# Patient Record
Sex: Female | Born: 1995 | Race: White | Hispanic: No | Marital: Single | Smoking: Never smoker
Health system: Southern US, Community
[De-identification: ages and names within clinical notes are randomized; demographics above are authoritative.]

---
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
## 2019-03-13 ENCOUNTER — Ambulatory Visit (LOCAL_COMMUNITY_HEALTH_CENTER): Payer: Self-pay

## 2019-03-13 ENCOUNTER — Other Ambulatory Visit: Payer: Self-pay

## 2019-03-13 DIAGNOSIS — Z23 Encounter for immunization: Secondary | ICD-10-CM

## 2019-03-29 ENCOUNTER — Other Ambulatory Visit: Payer: Self-pay

## 2019-03-29 ENCOUNTER — Ambulatory Visit (INDEPENDENT_AMBULATORY_CARE_PROVIDER_SITE_OTHER): Payer: PRIVATE HEALTH INSURANCE | Admitting: Family Medicine

## 2019-03-29 ENCOUNTER — Encounter: Payer: Self-pay | Admitting: Family Medicine

## 2019-03-29 DIAGNOSIS — M999 Biomechanical lesion, unspecified: Secondary | ICD-10-CM | POA: Diagnosis not present

## 2019-03-29 DIAGNOSIS — M4306 Spondylolysis, lumbar region: Secondary | ICD-10-CM

## 2019-03-29 DIAGNOSIS — M4187 Other forms of scoliosis, lumbosacral region: Secondary | ICD-10-CM | POA: Diagnosis not present

## 2019-03-29 MED ORDER — GABAPENTIN 100 MG PO CAPS: 200.0000 mg | ORAL_CAPSULE | Freq: Every day | ORAL | 3 refills | Status: DC

## 2019-03-29 MED ORDER — VITAMIN D (ERGOCALCIFEROL) 1.25 MG (50000 UNIT) PO CAPS: 50000.0000 [IU] | ORAL_CAPSULE | ORAL | 0 refills | Status: DC

## 2019-03-29 NOTE — Patient Instructions (Addendum)
Good to see you Gabapentin 200 mg at night  Once weekly vitamin D for 12 weeks.  Exercise 3 times a week avoid extension Machine weights only See me again 4-5 weeks

## 2019-03-29 NOTE — Progress Notes (Signed)
[facility] [address] [place], [place] [address] Phone: [phone] Subjective:   I Rebecca Santos am serving as a Education administrator for Dr. Hulan Saas.    CC: Low back pain  [id number]  Rebecca Santos is a 23 y.o. female coming in with complaint of back pain. Sometimes pain radiates to the feet. Goes to the gym pretty often.  Onset- Chronic  Location - lower back   Character- sharp with sneezing, achy  Aggravating factors- sneezing Reliving factors-  Therapies tried- ice, heat, topical, oral  Severity-5 out of 10 but it is continuous   Patient has had a work-up for this back previously.  MRI of the back was done and independently visualized by me.  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 No past surgical history on file. Social History   Socioeconomic History  . Marital status: Single    Spouse name: Not on file  . Number of children: Not on file  . Years of education: Not on file  . Highest education level: Not on file  Occupational History  . Not on file  Social Needs  . Financial resource strain: Not on file  . Food insecurity    Worry: Not on file    Inability: Not on file  . Transportation needs    Medical: Not on file    Non-medical: Not on file  Tobacco Use  . Smoking status: Never Smoker  . Smokeless tobacco: Never Used  Substance and Sexual Activity  . Alcohol use: No  . Drug use: Not on file  . Sexual activity: Not on file  Lifestyle  . Physical activity    Days per week: Not on file    Minutes per session: Not on file  . Stress: Not on file  Relationships  . Social Herbalist on phone: Not on file    Gets together: Not on file    Attends religious service: Not on file    Active member of club or organization: Not on file    Attends meetings of clubs or organizations: Not on file   Relationship status: Not on file  Other Topics Concern  . Not on file  Social History Narrative  . Not on file   No Known Allergies No family history on file.     Current Outpatient Medications (Analgesics):  .  ibuprofen (ADVIL,MOTRIN) 600 MG tablet, Take 1 tablet (600 mg total) by mouth every 8 (eight) hours as needed.   Current Outpatient Medications (Other):  .  cyclobenzaprine (FLEXERIL) 10 MG tablet, Take 1 tablet (10 mg total) by mouth 3 (three) times daily as needed for muscle spasms. Marland Kitchen  gabapentin (NEURONTIN) 100 MG capsule, Take 2 capsules (200 mg total) by mouth at bedtime. .  Vitamin D, Ergocalciferol, (DRISDOL) 1.25 MG (50000 UT) CAPS capsule, Take 1 capsule (50,000 Units total) by mouth every 7 (seven) days.    Past medical history, social, surgical and family history all reviewed in electronic medical record.  No pertanent information unless stated regarding to the chief complaint.   Review of Systems:  No headache, visual changes, nausea, vomiting, diarrhea, constipation, dizziness, abdominal pain, skin rash, fevers, chills, night sweats, weight loss, swollen lymph nodes, body aches, joint swelling, muscle aches, chest pain, shortness of breath, mood changes.   Objective  Blood pressure 140/80,  pulse 94, height 5\' 4"  (1.626 m), weight 143 lb (64.9 kg), SpO2 98 %. Systems examined below as of    General: No apparent distress alert and oriented x3 mood and affect normal, dressed appropriately.  HEENT: Pupils equal, extraocular movements intact  Respiratory: Patient's speak in full sentences and does not appear short of breath  Cardiovascular: No lower extremity edema, non tender, no erythema  Skin: Warm dry intact with no signs of infection or rash on extremities or on axial skeleton.  Abdomen: Soft nontender  Neuro: Cranial nerves II through XII are intact, neurovascularly intact in all extremities with 2+ DTRs and 2+ pulses.  Lymph: No lymphadenopathy of  posterior or anterior cervical chain or axillae bilaterally.  Gait normal with good balance and coordination.  MSK:  Non tender with full range of motion and good stability and symmetric strength and tone of shoulders, elbows, wrist, hip, knee and ankles bilaterally.  Back Exam:  Inspection: Very mild levoscoliosis noted in the lumbar scoliosis area. Motion: Flexion 45 deg, Extension 25 deg with worsening pain, Side Bending to 45 deg bilaterally,  Rotation to 45 deg bilaterally  SLR laying: Negative  XSLR laying: Negative  Palpable tenderness: Tender to palpation in paraspinal musculature lumbar spine right greater than left. FABER: Mild tightness bilaterally. Sensory change: Gross sensation intact to all lumbar and sacral dermatomes.  Reflexes: 2+ at both patellar tendons, 2+ at achilles tendons, Babinski's downgoing.  Strength at foot  Plantar-flexion: 5/5 Dorsi-flexion: 5/5 Eversion: 5/5 Inversion: 5/5  Leg strength  Quad: 5/5 Hamstring: 5/5 Hip flexor: 5/5 Hip abductors: 5/5    Osteopathic findings  T6 extended rotated and side bent left L1-4 neutral rotated left and side bent right Sacrum right on right    Impression and Recommendations:     This case required medical decision making of moderate complexity. The above documentation has been reviewed and is accurate and complete , DO       Note: This dictation was prepared with Dragon dictation along with smaller phrase technology. Any transcriptional errors that result from this process are unintentional.

## 2019-03-30 ENCOUNTER — Encounter: Payer: Self-pay | Admitting: Family Medicine

## 2019-03-30 DIAGNOSIS — M4306 Spondylolysis, lumbar region: Secondary | ICD-10-CM | POA: Insufficient documentation

## 2019-03-30 DIAGNOSIS — M999 Biomechanical lesion, unspecified: Secondary | ICD-10-CM | POA: Insufficient documentation

## 2019-03-30 DIAGNOSIS — M419 Scoliosis, unspecified: Secondary | ICD-10-CM | POA: Insufficient documentation

## 2019-03-30 NOTE — Assessment & Plan Note (Signed)
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  Gabapentin and muscle relaxer is within treatment protocol for her.  Once weekly vitamin D for muscle strength and endurance.  I do not see any sign of serious stress reaction.  Discussed icing regimen and home exercises.  Work with Product/process development scientist.  Responded well to osteopathic medicine duration.  Follow-up again in 4 to 8 weeks

## 2019-03-30 NOTE — Assessment & Plan Note (Signed)
Decision today to treat with OMT was based on Physical Exam  After verbal consent patient was treated with HVLA, ME, FPR techniques in , thoracic, lumbar and sacral areas  Patient tolerated the procedure well with improvement in symptoms  Patient given exercises, stretches and lifestyle modifications  See medications in patient instructions if given  Patient will follow up in 4-8 weeks 

## 2019-04-28 NOTE — Progress Notes (Deleted)
[facility] [address] [place], [place] [address] Phone: [phone] Subjective:    I'm seeing this patient by the request  of:    CC: Low back pain follow-up  [id number]  Rebecca Santos is a 23 y.o. female coming in with complaint of ***  Onset-  Location Duration-  Character- Aggravating factors- Reliving factors-  Therapies tried-  Severity-   Previous MRI did show central disc protrusion but no true nerve impingement.  Small pars defect noted at L5-S1  No past medical history on file. No past surgical history on file. Social History   Socioeconomic History  . Marital status: Single    Spouse name: Not on file  . Number of children: Not on file  . Years of education: Not on file  . Highest education level: Not on file  Occupational History  . Not on file  Social Needs  . Financial resource strain: Not on file  . Food insecurity    Worry: Not on file    Inability: Not on file  . Transportation needs    Medical: Not on file    Non-medical: Not on file  Tobacco Use  . Smoking status: Never Smoker  . Smokeless tobacco: Never Used  Substance and Sexual Activity  . Alcohol use: No  . Drug use: Not on file  . Sexual activity: Not on file  Lifestyle  . Physical activity    Days per week: Not on file    Minutes per session: Not on file  . Stress: Not on file  Relationships  . Social Herbalist on phone: Not on file    Gets together: Not on file    Attends religious service: Not on file    Active member of club or organization: Not on file    Attends meetings of clubs or organizations: Not on file    Relationship status: Not on file  Other Topics Concern  . Not on file  Social History Narrative  . Not on file   No Known Allergies No family history on file.     Current Outpatient Medications (Analgesics):  .  ibuprofen (ADVIL,MOTRIN) 600 MG tablet, Take 1 tablet (600 mg total) by mouth every 8 (eight) hours  as needed.   Current Outpatient Medications (Other):  .  cyclobenzaprine (FLEXERIL) 10 MG tablet, Take 1 tablet (10 mg total) by mouth 3 (three) times daily as needed for muscle spasms. Marland Kitchen  gabapentin (NEURONTIN) 100 MG capsule, Take 2 capsules (200 mg total) by mouth at bedtime. .  Vitamin D, Ergocalciferol, (DRISDOL) 1.25 MG (50000 UT) CAPS capsule, Take 1 capsule (50,000 Units total) by mouth every 7 (seven) days.    Past medical history, social, surgical and family history all reviewed in electronic medical record.  No pertanent information unless stated regarding to the chief complaint.   Review of Systems:  No headache, visual changes, nausea, vomiting, diarrhea, constipation, dizziness, abdominal pain, skin rash, fevers, chills, night sweats, weight loss, swollen lymph nodes, body aches, joint swelling, muscle aches, chest pain, shortness of breath, mood changes.   Objective  There were no vitals taken for this visit. Systems examined below as of    General: No apparent distress alert and oriented x3 mood and affect normal, dressed appropriately.  HEENT: Pupils equal, extraocular movements intact  Respiratory: Patient's speak in full sentences and does not appear short of breath  Cardiovascular: No lower extremity edema, non tender, no erythema  Skin: Warm dry intact with no signs of infection or rash on extremities or on axial skeleton.  Abdomen: Soft nontender  Neuro: Cranial nerves II through XII are intact, neurovascularly intact in all extremities with 2+ DTRs and 2+ pulses.  Lymph: No lymphadenopathy of posterior or anterior cervical chain or axillae bilaterally.  Gait normal with good balance and coordination.  MSK:  Non tender with full range of motion and good stability and symmetric strength and tone of shoulders, elbows, wrist, hip, knee and ankles bilaterally.  Back Exam:  Inspection: Unremarkable  Motion: Flexion 45 deg, Extension 45 deg, Side Bending to 45 deg  bilaterally,  Rotation to 45 deg bilaterally  SLR laying: Negative  XSLR laying: Negative  Palpable tenderness: None. FABER: negative. Sensory change: Gross sensation intact to all lumbar and sacral dermatomes.  Reflexes: 2+ at both patellar tendons, 2+ at achilles tendons, Babinski's downgoing.  Strength at foot  Plantar-flexion: 5/5 Dorsi-flexion: 5/5 Eversion: 5/5 Inversion: 5/5  Leg strength  Quad: 5/5 Hamstring: 5/5 Hip flexor: 5/5 Hip abductors: 5/5  Gait unremarkable.   Impression and Recommendations:     This case required medical decision making of moderate complexity. The above documentation has been reviewed and is accurate and complete Judi Saa, DO       Note: This dictation was prepared with Dragon dictation along with smaller phrase technology. Any transcriptional errors that result from this process are unintentional.

## 2019-04-29 ENCOUNTER — Ambulatory Visit: Payer: PRIVATE HEALTH INSURANCE | Admitting: Family Medicine

## 2019-05-31 ENCOUNTER — Encounter: Payer: Self-pay | Admitting: Family Medicine

## 2019-05-31 ENCOUNTER — Other Ambulatory Visit (INDEPENDENT_AMBULATORY_CARE_PROVIDER_SITE_OTHER): Payer: PRIVATE HEALTH INSURANCE

## 2019-05-31 ENCOUNTER — Ambulatory Visit (INDEPENDENT_AMBULATORY_CARE_PROVIDER_SITE_OTHER): Payer: PRIVATE HEALTH INSURANCE | Admitting: Family Medicine

## 2019-05-31 VITALS — BP 130/80 | HR 96 | Ht 64.0 in

## 2019-05-31 DIAGNOSIS — M2550 Pain in unspecified joint: Secondary | ICD-10-CM

## 2019-05-31 DIAGNOSIS — M4306 Spondylolysis, lumbar region: Secondary | ICD-10-CM

## 2019-05-31 DIAGNOSIS — M999 Biomechanical lesion, unspecified: Secondary | ICD-10-CM

## 2019-05-31 LAB — COMPREHENSIVE METABOLIC PANEL
ALT: 10 U/L (ref 0–35)
AST: 15 U/L (ref 0–37)
Albumin: 4.4 g/dL (ref 3.5–5.2)
Alkaline Phosphatase: 43 U/L (ref 39–117)
BUN: 9 mg/dL (ref 6–23)
CO2: 25 mEq/L (ref 19–32)
Calcium: 9.2 mg/dL (ref 8.4–10.5)
Chloride: 103 mEq/L (ref 96–112)
Creatinine, Ser: 0.82 mg/dL (ref 0.40–1.20)
GFR: 87.67 mL/min (ref >60.00)
Glucose, Bld: 88 mg/dL (ref 70–99)
Potassium: 4 mEq/L (ref 3.5–5.1)
Sodium: 137 mEq/L (ref 135–145)
Total Bilirubin: 0.7 mg/dL (ref 0.2–1.2)
Total Protein: 7 g/dL (ref 6.0–8.3)

## 2019-05-31 LAB — CBC WITH DIFFERENTIAL/PLATELET
Basophils Absolute: 0 10*3/uL (ref 0.0–0.1)
Basophils Relative: 0.7 % (ref 0.0–3.0)
Eosinophils Absolute: 0.1 10*3/uL (ref 0.0–0.7)
Eosinophils Relative: 1.4 % (ref 0.0–5.0)
HCT: 38.1 % (ref 36.0–46.0)
Hemoglobin: 12.8 g/dL (ref 12.0–15.0)
Lymphocytes Relative: 45.1 % (ref 12.0–46.0)
Lymphs Abs: 2.2 10*3/uL (ref 0.7–4.0)
MCHC: 33.7 g/dL (ref 30.0–36.0)
MCV: 86.8 fl (ref 78.0–100.0)
Monocytes Absolute: 0.4 10*3/uL (ref 0.1–1.0)
Monocytes Relative: 8.9 % (ref 3.0–12.0)
Neutro Abs: 2.2 10*3/uL (ref 1.4–7.7)
Neutrophils Relative %: 43.9 % (ref 43.0–77.0)
Platelets: 198 10*3/uL (ref 150.0–400.0)
RBC: 4.39 Mil/uL (ref 3.87–5.11)
RDW: 12.8 % (ref 11.5–15.5)
WBC: 4.9 10*3/uL (ref 4.0–10.5)

## 2019-05-31 LAB — FERRITIN: Ferritin: 23.3 ng/mL (ref 10.0–291.0)

## 2019-05-31 LAB — TSH: TSH: 1.43 u[IU]/mL (ref 0.35–4.50)

## 2019-05-31 LAB — IBC PANEL
Iron: 98 ug/dL (ref 42–145)
Saturation Ratios: 25.1 % (ref 20.0–50.0)
Transferrin: 279 mg/dL (ref 212.0–360.0)

## 2019-05-31 LAB — URIC ACID: Uric Acid, Serum: 4.7 mg/dL (ref 2.4–7.0)

## 2019-05-31 LAB — SEDIMENTATION RATE: Sed Rate: 5 mm/hr (ref 0–20)

## 2019-05-31 LAB — C-REACTIVE PROTEIN: CRP: <1 mg/dL (ref 0.5–20.0)

## 2019-05-31 NOTE — Patient Instructions (Signed)
  [address]st [address] [place], [place] [address] Phone [phone]  Continue conservate therapy More movement after the adjustment today More bike and elliptical  Labs downstairs See me again in 4 weeks

## 2019-05-31 NOTE — Progress Notes (Signed)
[facility] [address] [place], [place] [address] Phone: [phone] Subjective:   I Rebecca Santos am serving as a Neurosurgeon for Dr. Antoine Primas.  This visit occurred during the SARS-CoV-2 public health emergency.  Safety protocols were in place, including screening questions prior to the visit, additional usage of staff PPE, and extensive cleaning of exam room while observing appropriate contact time as indicated for disinfecting solutions.    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  Gabapentin and muscle relaxer is within treatment protocol for her.  Once weekly vitamin D for muscle strength and endurance.  I do not see any sign of serious stress reaction.  Discussed icing regimen and home exercises.  Work with Event organiser.  Responded well to osteopathic medicine duration.  Follow-up again in 4 to 8 weeks  Update 05/31/2019 Rebecca Santos is a 23 y.o. female coming in with complaint of back pain. States the back is not doing well. Feeling a little worse.  Patient states after manipulation did have some mild improvement but then seemed to worsen again fairly quickly.  Patient is having some deep pain on and can stop her from activity.  Has not been working out on a regular basis at the moment.     No past medical history on file. No past surgical history on file. Social History   Socioeconomic History  . Marital status: Single    Spouse name: Not on file  . Number of children: Not on file  . Years of education: Not on file  . Highest education level: Not on file  Occupational History  . Not on file  Social Needs  . Financial resource strain: Not on file  . Food insecurity    Worry: Not on file    Inability: Not on file  . Transportation needs    Medical: Not  on file    Non-medical: Not on file  Tobacco Use  . Smoking status: Never Smoker  . Smokeless tobacco: Never Used  Substance and Sexual Activity  . Alcohol use: No  . Drug use: Not on file  . Sexual activity: Not on file  Lifestyle  . Physical activity    Days per week: Not on file    Minutes per session: Not on file  . Stress: Not on file  Relationships  . Social Musician on phone: Not on file    Gets together: Not on file    Attends religious service: Not on file    Active member of club or organization: Not on file    Attends meetings of clubs or organizations: Not on file    Relationship status: Not on file  Other Topics Concern  . Not on file  Social History Narrative  . Not on file   No Known Allergies No family history on file.     Current Outpatient Medications (Analgesics):  .  ibuprofen (ADVIL,MOTRIN) 600 MG tablet, Take 1 tablet (600 mg total) by mouth every 8 (eight) hours as needed.   Current Outpatient Medications (Other):  .  cyclobenzaprine (FLEXERIL) 10 MG tablet, Take 1 tablet (10 mg total) by mouth 3 (three) times daily as needed for muscle spasms. Marland Kitchen  gabapentin (NEURONTIN) 100 MG capsule, Take 2 capsules (200  mg total) by mouth at bedtime. .  Vitamin D, Ergocalciferol, (DRISDOL) 1.25 MG (50000 UT) CAPS capsule, Take 1 capsule (50,000 Units total) by mouth every 7 (seven) days.    Past medical history, social, surgical and family history all reviewed in electronic medical record.  No pertanent information unless stated regarding to the chief complaint.   Review of Systems:  No headache, visual changes, nausea, vomiting, diarrhea, constipation, dizziness, abdominal pain, skin rash, fevers, chills, night sweats, weight loss, swollen lymph nodes,  joint swelling, , chest pain, shortness of breath, mood changes.  Positive muscle aches, body aches  Objective  Blood pressure 130/80, pulse 96, height 5\' 4"  (1.626 m), SpO2 98 %.    General:  No apparent distress alert and oriented x3 mood and affect normal, dressed appropriately.  HEENT: Pupils equal, extraocular movements intact  Respiratory: Patient's speak in full sentences and does not appear short of breath  Cardiovascular: No lower extremity edema, non tender, no erythema  Skin: Warm dry intact with no signs of infection or rash on extremities or on axial skeleton.  Abdomen: Soft nontender  Neuro: Cranial nerves II through XII are intact, neurovascularly intact in all extremities with 2+ DTRs and 2+ pulses.  Lymph: No lymphadenopathy of posterior or anterior cervical chain or axillae bilaterally.  Gait normal with good balance and coordination.  MSK:  Non tender with full range of motion and good stability and symmetric strength and tone of shoulders, elbows, wrist, hip, knee and ankles bilaterally.  Back exam still has some tenderness to palpation in the paraspinal musculature lumbar spine right greater than left.  Mild pain with extension. Negative straight leg test and negative stork test  Osteopathic findings  T9 extended rotated and side bent left L2 flexed rotated and side bent right L4 flexed rotated side bent left Sacrum right on right     Impression and Recommendations:     This case required medical decision making of moderate complexity. The above documentation has been reviewed and is accurate and complete Lyndal Pulley, DO       Note: This dictation was prepared with Dragon dictation along with smaller phrase technology. Any transcriptional errors that result from this process are unintentional.

## 2019-05-31 NOTE — Assessment & Plan Note (Addendum)
Discussed posture and ergonomics.  Discussed which activities to do which wants to avoid.  Patient will increase activity as tolerated.  Follow-up again in 4 weeks.  Worsening symptoms will get advanced imaging.  Today the we will start with laboratory work-up to rule out any other pathology that could be contributing to pain.

## 2019-06-04 ENCOUNTER — Other Ambulatory Visit: Payer: Self-pay | Admitting: *Deleted

## 2019-06-04 LAB — ANA: Anti Nuclear Antibody (ANA): NEGATIVE

## 2019-06-04 LAB — VITAMIN D 1,25 DIHYDROXY
Vitamin D 1, 25 (OH)2 Total: 61 pg/mL (ref 18–72)
Vitamin D2 1, 25 (OH)2: 39 pg/mL
Vitamin D3 1, 25 (OH)2: 22 pg/mL

## 2019-06-04 LAB — PTH, INTACT AND CALCIUM
Calcium: 9.1 mg/dL (ref 8.6–10.2)
PTH: 19 pg/mL (ref 14–64)

## 2019-06-04 LAB — ANGIOTENSIN CONVERTING ENZYME: Angiotensin-Converting Enzyme: 36 U/L (ref 9–67)

## 2019-06-04 LAB — CYCLIC CITRUL PEPTIDE ANTIBODY, IGG: Cyclic Citrullin Peptide Ab: <16 UNITS

## 2019-06-04 LAB — CALCIUM, IONIZED: Calcium, Ion: 5.09 mg/dL (ref 4.8–5.6)

## 2019-06-04 LAB — RHEUMATOID FACTOR: Rheumatoid fact SerPl-aCnc: <14 IU/mL (ref <14)

## 2019-06-04 MED ORDER — PREDNISONE 50 MG PO TABS: ORAL_TABLET | ORAL | 0 refills | Status: DC

## 2019-06-04 NOTE — Telephone Encounter (Signed)
Pt made aware prednisone sent into pharmacy.

## 2019-07-05 ENCOUNTER — Ambulatory Visit: Payer: PRIVATE HEALTH INSURANCE | Admitting: Family Medicine

## 2020-07-15 ENCOUNTER — Other Ambulatory Visit: Payer: PRIVATE HEALTH INSURANCE

## 2021-12-02 ENCOUNTER — Ambulatory Visit: Payer: Self-pay | Admitting: Internal Medicine

## 2022-01-17 ENCOUNTER — Ambulatory Visit: Payer: 59 | Admitting: Nurse Practitioner

## 2022-02-21 ENCOUNTER — Ambulatory Visit: Payer: 59 | Admitting: Nurse Practitioner

## 2022-02-21 ENCOUNTER — Other Ambulatory Visit: Payer: Self-pay

## 2022-02-21 ENCOUNTER — Encounter: Payer: Self-pay | Admitting: Nurse Practitioner

## 2022-02-21 VITALS — BP 120/84 | HR 74 | Temp 98.5°F | Resp 18 | Ht 65.0 in | Wt 144.1 lb

## 2022-02-21 DIAGNOSIS — Z13220 Encounter for screening for lipoid disorders: Secondary | ICD-10-CM | POA: Diagnosis not present

## 2022-02-21 DIAGNOSIS — Z0000 Encounter for general adult medical examination without abnormal findings: Secondary | ICD-10-CM

## 2022-02-21 DIAGNOSIS — Z114 Encounter for screening for human immunodeficiency virus [HIV]: Secondary | ICD-10-CM

## 2022-02-21 DIAGNOSIS — Z131 Encounter for screening for diabetes mellitus: Secondary | ICD-10-CM

## 2022-02-21 DIAGNOSIS — Z1159 Encounter for screening for other viral diseases: Secondary | ICD-10-CM

## 2022-02-21 DIAGNOSIS — Z130 Encounter for screening for diseases of the blood and blood-forming organs and certain disorders involving the immune mechanism: Secondary | ICD-10-CM | POA: Diagnosis not present

## 2022-02-21 DIAGNOSIS — Z7689 Persons encountering health services in other specified circumstances: Secondary | ICD-10-CM

## 2022-02-21 NOTE — Progress Notes (Signed)
Name: Rebecca Santos   MRN: [id number]    DOB: Jul 17, 1995   Date:02/21/2022       Progress Note  Subjective  Chief Complaint  Chief Complaint  Patient presents with   Establish Care    HPI  Patient presents for annual CPE and establish care.  Establish care : Her last physical was last year, she is up to date on her pap. Has history of pars defect of lumbar spine, scoliosis, nonallopathic lesion of sacral, lumbosacral and thoracic region  Diet: She says she eats what she wants. She tries to eat healthier, she does not eat a lot of sweets Exercise: gym, weight lifting for about four days a week, for an hour.  Sleep: 7 hours Last dental exam:this year Last eye exam: this month  [facility] Visit from 02/21/2022 in [facility]  AUDIT-C Score 1      Depression: Phq 9 is  negative    02/21/2022    9:50 AM  Depression screen PHQ 2/9  Decreased Interest 0  Down, Depressed, Hopeless 0  PHQ - 2 Score 0   Hypertension: BP Readings from Last 3 Encounters:  02/21/22 120/84  05/31/19 130/80  03/29/19 140/80   Obesity: Wt Readings from Last 3 Encounters:  02/21/22 144 lb 1.6 oz (65.4 kg)  03/29/19 143 lb (64.9 kg)  02/22/16 134 lb (60.8 kg) (67 %, Z= 0.44)*   * Growth percentiles are based on CDC (Girls, 2-20 Years) data.   BMI Readings from Last 3 Encounters:  02/21/22 23.98 kg/m  05/31/19 24.55 kg/m  03/29/19 24.55 kg/m     Vaccines:  HPV: up to at age 37 , ask insurance if age between 25-45  Shingrix: 32-64 yo and ask insurance if covered when patient above 87 yo Pneumonia:  educated and discussed with patient. Flu:  educated and discussed with patient.  Hep C Screening: ordered STD testing and prevention (HIV/chl/gon/syphilis): ordered Intimate partner violence:none Sexual History :She is not having intercourse, not on birth control Menstrual History/LMP/Abnormal Bleeding: [facility]: Feb 07, 2022 Incontinence Symptoms: none  Breast  cancer:  - Last Mammogram: aunt had breast cancer,  no concerns, does not qualify - BRCA gene screening: none  Osteoporosis: Discussed high calcium and vitamin D supplementation, weight bearing exercises  Cervical cancer screening: 06/08/2021  Skin cancer: Discussed monitoring for atypical lesions  Colorectal cancer: no concerns, does not qualify   Lung cancer:   Low Dose CT Chest recommended if Age 80-80 years, 20 pack-year currently smoking OR have quit w/in 15years. Patient does not qualify.   ECG: none  Advanced Care Planning: A voluntary discussion about advance care planning including the explanation and discussion of advance directives.  Discussed health care proxy and Living will, and the patient was able to identify a health care proxy as mom.  Patient does not have a living will at present time. If patient does have living will, I have requested they bring this to the clinic to be scanned in to their chart.  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 No pertinent surgical history.  Family History  Problem Relation Age of Onset   Hypertension Mother    Diverticulitis Mother     Social History   Socioeconomic History   Marital status: Single    Spouse name: Not on file   Number of children: Not on file   Years of education: Not on file   Highest education level: Not on file  Occupational History   Not on file  Tobacco Use    Smoking status: Never   Smokeless tobacco: Never  Vaping Use   Vaping Use: Never used  Substance and Sexual Activity   Alcohol use: Yes   Drug use: Never   Sexual activity: Not Currently  Other Topics Concern   Not on file  Social History Narrative   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[place] Strain: Low Risk  (02/21/2022)   Overall Financial Resource Strain (CARDIA)    Difficulty of Paying Living Expenses: Not hard at all  Food Insecurity: No Food Insecurity (02/21/2022)   Hunger Vital Sign    Worried About Running Out of Food in the Last Year: Never true    [place] in the Last Year: Never true  Transportation Needs: No Transportation Needs (02/21/2022)   PRAPARE - Hydrologist (Medical): No    Lack of Transportation (Non-Medical): No  Physical Activity: Sufficiently Active (02/21/2022)   Exercise Vital Sign    Days of Exercise per Week: 4 days    Minutes of Exercise per Session: 60 min  Stress: No Stress Concern Present (02/21/2022)   [place]    Feeling of Stress : Only a little  Recent Concern: Stress - Stress Concern Present (02/21/2022)   [place]    Feeling of Stress : To some extent  Social Connections: Moderately Integrated (02/21/2022)   Social Connection and Isolation Panel [NHANES]    Frequency of Communication with Friends and Family: More than three times a week    Frequency of Social Gatherings with Friends and Family: More than three times a week    Attends Religious Services: More than 4 times per year    Active Member of [employer] or Organizations: Yes    Attends Archivist Meetings: More than 4 times per year    Marital Status: Never married  Intimate Partner Violence: Not At Risk (02/21/2022)   Humiliation, Afraid, Rape, and Kick  questionnaire    Fear of Current or Ex-Partner: No    Emotionally Abused: No    Physically Abused: No    Sexually Abused: No    No current outpatient medications on file.  No Known Allergies   ROS  Constitutional: Negative for fever or weight change.  Respiratory: Negative for cough and shortness of breath.   Cardiovascular: Negative for chest pain or palpitations.  Gastrointestinal: Negative for abdominal pain, no bowel changes.  Musculoskeletal: Negative for gait problem or joint swelling.  Skin: Negative for rash.  Neurological: Negative for dizziness or headache.  No other specific complaints in a complete review of systems (except as listed in HPI above).   Objective  Vitals:   02/21/22 0950  BP: 120/84  Pulse: 74  Resp: 18  Temp: 98.5 F (36.9  C)  TempSrc: Oral  SpO2: 98%  Weight: 144 lb 1.6 oz (65.4 kg)  Height: 5' 5"  (1.651 m)    Body mass index is 23.98 kg/m.  Physical Exam Constitutional: Patient appears well-developed and well-nourished. No distress.  HENT: Head: Normocephalic and atraumatic. Ears: B TMs ok, no erythema or effusion; Nose: Nose normal. Mouth/Throat: Oropharynx is clear and moist. No oropharyngeal exudate.  Eyes: Conjunctivae and EOM are normal. Pupils are equal, round, and reactive to light. No scleral icterus.  Neck: Normal range of motion. Neck supple. No JVD present. No thyromegaly present.  Cardiovascular: Normal rate, regular rhythm and normal heart sounds.  No murmur heard. No BLE edema. Pulmonary/Chest: Effort normal and breath sounds normal. No respiratory distress. Abdominal: Soft. Bowel sounds are normal, no distension. There is no tenderness. no masses Breast: no lumps or masses, no nipple discharge or rashes Musculoskeletal: Normal range of motion, no joint effusions. No gross deformities Neurological: he is alert and oriented to person, place, and time. No cranial nerve deficit. Coordination, balance, strength, speech and gait  are normal.  Skin: Skin is warm and dry. No rash noted. No erythema.  Psychiatric: Patient has a normal mood and affect. behavior is normal. Judgment and thought content normal.   No results found for this or any previous visit (from the past 2160 hour(s)).    Fall Risk:    02/21/2022    9:49 AM  Fall Risk   Falls in the past year? 0  Number falls in past yr: 0  Injury with Fall? 0  Follow up Falls evaluation completed     Functional Status Survey: Is the patient deaf or have difficulty hearing?: No Does the patient have difficulty seeing, even when wearing glasses/contacts?: No Does the patient have difficulty concentrating, remembering, or making decisions?: No Does the patient have difficulty walking or climbing stairs?: No Does the patient have difficulty dressing or bathing?: No Does the patient have difficulty doing errands alone such as visiting a doctor's office or shopping?: No   Assessment & Plan  1. Encounter for annual physical exam  - CBC with Differential/Platelet - COMPLETE METABOLIC PANEL WITH GFR - Lipid panel - Hemoglobin A1c - Hepatitis C antibody - HIV Antibody (routine testing w rflx)  2. Screening for diabetes mellitus  - COMPLETE METABOLIC PANEL WITH GFR - Hemoglobin A1c  3. Screening for cholesterol level  - Lipid panel  4. Screening for deficiency anemia  - CBC with Differential/Platelet  5. Encounter for hepatitis C screening test for low risk patient  - Hepatitis C antibody  6. Screening for HIV without presence of risk factors  - HIV Antibody (routine testing w rflx)  7. Encounter to establish care Up to date on physical and pap    -USPSTF grade A and B recommendations reviewed with patient; age-appropriate recommendations, preventive care, screening tests, etc discussed and encouraged; healthy living encouraged; see AVS for patient education given to patient -Discussed importance of 150 minutes of physical activity weekly,  eat two servings of fish weekly, eat one serving of tree nuts ( cashews, pistachios, pecans, almonds.[email or web address]) every other day, eat 6 servings of fruit/vegetables daily and drink plenty of water and avoid sweet beverages.

## 2022-02-23 ENCOUNTER — Encounter: Payer: Self-pay | Admitting: Nurse Practitioner

## 2022-02-27 LAB — COMPLETE METABOLIC PANEL WITH GFR
AG Ratio: 1.7 (calc) (ref 1.0–2.5)
ALT: 12 U/L (ref 6–29)
AST: 16 U/L (ref 10–30)
Albumin: 4.7 g/dL (ref 3.6–5.1)
Alkaline phosphatase (APISO): 41 U/L (ref 31–125)
BUN: 10 mg/dL (ref 7–25)
CO2: 24 mmol/L (ref 20–32)
Calcium: 9.6 mg/dL (ref 8.6–10.2)
Chloride: 105 mmol/L (ref 98–110)
Creat: 0.79 mg/dL (ref 0.50–0.96)
Globulin: 2.7 g/dL (calc) (ref 1.9–3.7)
Glucose, Bld: 85 mg/dL (ref 65–99)
Potassium: 4.4 mmol/L (ref 3.5–5.3)
Sodium: 140 mmol/L (ref 135–146)
Total Bilirubin: 0.7 mg/dL (ref 0.2–1.2)
Total Protein: 7.4 g/dL (ref 6.1–8.1)
eGFR: 107 mL/min/{1.73_m2} (ref >=60)

## 2022-02-27 LAB — CBC WITH DIFFERENTIAL/PLATELET
Absolute Monocytes: 501 cells/uL (ref 200–950)
Basophils Absolute: 33 cells/uL (ref 0–200)
Basophils Relative: 0.5 %
Eosinophils Absolute: 39 cells/uL (ref 15–500)
Eosinophils Relative: 0.6 %
HCT: 40.1 % (ref 35.0–45.0)
Hemoglobin: 13.8 g/dL (ref 11.7–15.5)
Lymphs Abs: 2353 cells/uL (ref 850–3900)
MCH: 29.8 pg (ref 27.0–33.0)
MCHC: 34.4 g/dL (ref 32.0–36.0)
MCV: 86.6 fL (ref 80.0–100.0)
MPV: 10.6 fL (ref 7.5–12.5)
Monocytes Relative: 7.7 %
Neutro Abs: 3575 cells/uL (ref 1500–7800)
Neutrophils Relative %: 55 %
Platelets: 269 10*3/uL (ref 140–400)
RBC: 4.63 10*6/uL (ref 3.80–5.10)
RDW: 11.7 % (ref 11.0–15.0)
Total Lymphocyte: 36.2 %
WBC: 6.5 10*3/uL (ref 3.8–10.8)

## 2022-02-27 LAB — HIV-1 RNA, QUALITATIVE, TMA: HIV-1 RNA, Qualitative, TMA: NOT DETECTED

## 2022-02-27 LAB — HEMOGLOBIN A1C
Hgb A1c MFr Bld: 5.1 % of total Hgb (ref <5.7)
Mean Plasma Glucose: 100 mg/dL
eAG (mmol/L): 5.5 mmol/L

## 2022-02-27 LAB — HEPATITIS C ANTIBODY: Hepatitis C Ab: NONREACTIVE

## 2022-02-27 LAB — LIPID PANEL
Cholesterol: 147 mg/dL (ref <200)
HDL: 64 mg/dL (ref >=50)
LDL Cholesterol (Calc): 68 mg/dL (calc)
Non-HDL Cholesterol (Calc): 83 mg/dL (calc) (ref <130)
Total CHOL/HDL Ratio: 2.3 (calc) (ref <5.0)
Triglycerides: 74 mg/dL (ref <150)

## 2022-02-27 LAB — HIV ANTIBODY (ROUTINE TESTING W REFLEX): HIV 1&2 Ab, 4th Generation: REACTIVE — AB

## 2022-02-27 LAB — HIV-1/2 AB - DIFFERENTIATION
HIV-1 antibody: NEGATIVE
HIV-2 Ab: NEGATIVE

## 2022-03-08 ENCOUNTER — Ambulatory Visit: Payer: 59 | Admitting: Nurse Practitioner

## 2022-03-15 ENCOUNTER — Ambulatory Visit: Payer: 59 | Admitting: Nurse Practitioner

## 2022-05-30 ENCOUNTER — Ambulatory Visit: Payer: Self-pay | Admitting: Internal Medicine

## 2022-10-04 ENCOUNTER — Ambulatory Visit: Payer: 59 | Admitting: Nurse Practitioner

## 2022-11-14 ENCOUNTER — Ambulatory Visit (LOCAL_COMMUNITY_HEALTH_CENTER): Payer: Self-pay

## 2022-11-14 DIAGNOSIS — Z111 Encounter for screening for respiratory tuberculosis: Secondary | ICD-10-CM

## 2022-11-17 ENCOUNTER — Ambulatory Visit (LOCAL_COMMUNITY_HEALTH_CENTER): Payer: 59

## 2022-11-17 DIAGNOSIS — Z111 Encounter for screening for respiratory tuberculosis: Secondary | ICD-10-CM

## 2022-11-17 LAB — TB SKIN TEST
Induration: 0 mm
TB Skin Test: NEGATIVE

## 2023-06-16 ENCOUNTER — Other Ambulatory Visit: Payer: 59

## 2023-06-16 ENCOUNTER — Ambulatory Visit: Payer: BC Managed Care – PPO

## 2023-06-16 DIAGNOSIS — Z719 Counseling, unspecified: Secondary | ICD-10-CM

## 2023-06-16 NOTE — Progress Notes (Signed)
In nurse clinic for PPD. Patient reports having PPD placed and read in May 2024, but states her school will not take the results unless the provider's name is printed or stamped on the paperwork. RN provided patient with copy of placement and results from 11/14/22 and 11/17/22 (with names of nurse who placed/read printed on copy). ROI signed. Patient walked to finance window to get refund for PPD. All questions answered and verbalizes understanding.  Abagail Kitchens, RN

## 2023-06-19 ENCOUNTER — Other Ambulatory Visit: Payer: BC Managed Care – PPO

## 2023-07-03 ENCOUNTER — Encounter: Payer: Self-pay | Admitting: Nurse Practitioner

## 2023-07-03 ENCOUNTER — Ambulatory Visit: Payer: BC Managed Care – PPO | Admitting: Nurse Practitioner

## 2023-07-03 VITALS — BP 122/80 | HR 87 | Temp 98.2°F | Resp 18 | Ht 65.0 in | Wt 152.7 lb

## 2023-07-03 DIAGNOSIS — Z0000 Encounter for general adult medical examination without abnormal findings: Secondary | ICD-10-CM

## 2023-07-03 DIAGNOSIS — Z131 Encounter for screening for diabetes mellitus: Secondary | ICD-10-CM

## 2023-07-03 DIAGNOSIS — Z130 Encounter for screening for diseases of the blood and blood-forming organs and certain disorders involving the immune mechanism: Secondary | ICD-10-CM

## 2023-07-03 DIAGNOSIS — Z13220 Encounter for screening for lipoid disorders: Secondary | ICD-10-CM

## 2023-07-03 DIAGNOSIS — Z0001 Encounter for general adult medical examination with abnormal findings: Secondary | ICD-10-CM

## 2023-07-03 DIAGNOSIS — Z0289 Encounter for other administrative examinations: Secondary | ICD-10-CM

## 2023-07-03 NOTE — Progress Notes (Signed)
 Name: Rebecca Santos   MRN: [id number]    DOB: 1995/09/30   Date:07/03/2023       Progress Note  Subjective  Chief Complaint  Chief Complaint  Patient presents with   Medical Management of Chronic Issues    HPI  Patient presents for annual CPE.  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   Sleep: 6 hours Last dental exam:scheduled in February  Last eye exam: last year  [employer] Visit from 07/03/2023 in [facility]  AUDIT-C Score 2       Depression: Phq 9 is  negative    07/03/2023   10:51 AM 02/21/2022    9:50 AM  Depression screen PHQ 2/9  Decreased Interest 0 0  Down, Depressed, Hopeless 0 0  PHQ - 2 Score 0 0  Altered sleeping 0   Tired, decreased energy 0   Change in appetite 0   Feeling bad or failure about yourself  0   Trouble concentrating 0   Moving slowly or fidgety/restless 0   Suicidal thoughts 0   PHQ-9 Score 0   Difficult doing work/chores Not difficult at all    Hypertension: BP Readings from Last 3 Encounters:  07/03/23 122/80  02/21/22 120/84  05/31/19 130/80   Obesity: Wt Readings from Last 3 Encounters:  07/03/23 152 lb 11.2 oz (69.3 kg)  02/21/22 144 lb 1.6 oz (65.4 kg)  03/29/19 143 lb (64.9 kg)   BMI Readings from Last 3 Encounters:  07/03/23 25.41 kg/m  02/21/22 23.98 kg/m  05/31/19 24.55 kg/m     Vaccines:  HPV: up to at age 26 , ask insurance if age between 49-45  Shingrix: 26-64 yo and ask insurance if covered when patient above 54 yo Pneumonia:  educated and discussed with patient. Flu:  educated and discussed with patient.  Hep C Screening: completed STD testing and prevention (HIV/chl/gon/syphilis): completed Intimate partner violence:none Sexual History : yes, not on birth control, declines Menstrual History/LMP/Abnormal Bleeding: [facility]: 06/27/2023 Incontinence Symptoms: none  Breast cancer:  - Last Mammogram: does not qualify - BRCA gene screening:  none  Osteoporosis: Discussed high calcium and vitamin D  supplementation, weight bearing exercises  Cervical cancer screening: 06/08/2021  Skin cancer: Discussed monitoring for atypical lesions  Colorectal cancer: does not qualify   Lung cancer:   Low Dose CT Chest recommended if Age 26-80 years, 20 pack-year currently smoking OR have quit w/in 15years. Patient does not qualify.   ECG: none  Advanced Care Planning: A voluntary discussion about advance care planning including the explanation and discussion of advance directives.  Discussed health care proxy and Living will, and the patient was able to identify a health care proxy as mom.  Patient does not have a living will at present time. If patient does have living will, I have requested they bring this to the clinic to be scanned in to their chart.  Lipids: Lab Results  Component Value Date   CHOL 147 02/21/2022   Lab Results  Component Value Date   HDL 64 02/21/2022   Lab Results  Component Value Date   LDLCALC 68 02/21/2022   Lab Results  Component Value Date   TRIG 74 02/21/2022   Lab Results  Component Value Date   CHOLHDL 2.3 02/21/2022   No results found for: LDLDIRECT  Glucose: Glucose, Bld  Date Value Ref Range Status  02/21/2022 85 65 - 99 mg/dL Final    Comment:    .  Fasting reference interval .   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 No pertinent surgical history.  Family History  Problem Relation Age of Onset   Hypertension Mother    Diverticulitis Mother     Social History   Socioeconomic History   Marital status: Single    Spouse name: Not on file   Number of children: Not on  file   Years of education: Not on file   Highest education level: Associate degree: academic program  Occupational History   Not on file  Tobacco Use   Smoking status: Never   Smokeless tobacco: Never  Vaping Use   Vaping status: Never Used  Substance and Sexual Activity   Alcohol use: Yes   Drug use: Never   Sexual activity: Not Currently  Other Topics Concern   Not on file  Social History Narrative   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    Social Drivers of Health   Financial Resource Strain: Low Risk  (06/29/2023)   Overall Financial Resource Strain (CARDIA)    Difficulty of Paying Living Expenses: Not very hard  Food Insecurity: No Food Insecurity (06/29/2023)   Hunger Vital Sign    Worried About Running Out of Food in the Last Year: Never true    Ran Out of Food in the Last Year: Never true  Transportation Needs: No Transportation Needs (06/29/2023)   PRAPARE - Administrator, Civil Service (Medical): No    Lack of Transportation (Non-Medical): No  Physical Activity: Sufficiently Active (06/29/2023)   Exercise Vital Sign    Days of Exercise per Week: 6 days    Minutes of Exercise per Session: 80 min  Stress: No Stress Concern Present (06/29/2023)   [employer] of Occupational Health - Occupational Stress Questionnaire    Feeling of Stress : Only a little  Social Connections: Moderately Isolated (06/29/2023)   Social Connection and Isolation Panel [NHANES]    Frequency of Communication with Friends and Family: More than three times a week    Frequency of Social Gatherings with Friends and Family: Twice a week    Attends Religious Services: More than 4 times per year    Active Member of [employer] or Organizations: No    Attends Banker Meetings: Not on file    Marital Status: Never married  Intimate Partner Violence: Not At Risk (02/21/2022)   Humiliation, Afraid, Rape, and Kick questionnaire    Fear of Current or Ex-Partner: No    Emotionally  Abused: No    Physically Abused: No    Sexually Abused: No    No current outpatient medications on file.  No Known Allergies   ROS  Constitutional: Negative for fever or weight change.  Respiratory: Negative for cough and shortness of breath.   Cardiovascular: Negative for chest pain or palpitations.  Gastrointestinal: Negative for abdominal pain, no bowel changes.  Musculoskeletal: Negative for gait problem or joint swelling.  Skin: Negative for rash.  Neurological: Negative for dizziness or headache.  No other specific complaints in a complete review of systems (except as listed in HPI above).   Objective  Vitals:   07/03/23 1050  BP: 122/80  Pulse: 87  Resp: 18  Temp: 98.2 F (36.8 C)  SpO2: 98%  Weight: 152 lb 11.2 oz (69.3 kg)  Height: 5' 5 (1.651 m)    Body mass index is 25.41 kg/m.  Physical Exam Constitutional: Patient appears well-developed and well-nourished. No distress.  HENT: Head: Normocephalic and atraumatic. Ears: B TMs ok, no erythema or effusion; Nose: Nose normal. Mouth/Throat: Oropharynx is clear and moist. No oropharyngeal exudate.  Eyes: Conjunctivae and EOM are normal. Pupils are equal, round, and reactive to light. No scleral icterus.  Neck: Normal range of motion. Neck supple. No JVD present. No thyromegaly present.  Cardiovascular: Normal rate, regular rhythm and normal heart sounds.  No murmur heard. No BLE edema. Pulmonary/Chest: Effort normal and breath sounds normal. No respiratory distress. Abdominal: Soft. Bowel sounds are normal, no distension. There is no tenderness. no masses Breast: no lumps or masses, no nipple discharge or rashes Musculoskeletal: Normal range of motion, no joint effusions. No gross deformities Neurological: he is alert and oriented to person, place, and time. No cranial nerve deficit. Coordination, balance, strength, speech and gait are normal.  Skin: Skin is warm and dry. No rash noted. No erythema.   Psychiatric: Patient has a normal mood and affect. behavior is normal. Judgment and thought content normal.   No results found for this or any previous visit (from the past 2160 hours).    Fall Risk:    07/03/2023   10:51 AM 02/21/2022    9:49 AM  Fall Risk   Falls in the past year? 0 0  Number falls in past yr: 0 0  Injury with Fall? 0 0  Follow up  Falls evaluation completed     Functional Status Survey: Is the patient deaf or have difficulty hearing?: No Does the patient have difficulty seeing, even when wearing glasses/contacts?: No Does the patient have difficulty concentrating, remembering, or making decisions?: No Does the patient have difficulty walking or climbing stairs?: No Does the patient have difficulty dressing or bathing?: No Does the patient have difficulty doing errands alone such as visiting a doctor's office or shopping?: No   Assessment & Plan  1. Annual physical exam (Primary)  - CBC with Differential/Platelet - COMPLETE METABOLIC PANEL WITH GFR - Lipid panel - Hemoglobin A1c  2. Screening for deficiency anemia  - CBC with Differential/Platelet  3. Encounter for completion of form with patient   4. Screening for cholesterol level  - Lipid panel  5. Screening for diabetes mellitus  - COMPLETE METABOLIC PANEL WITH GFR - Hemoglobin A1c   -USPSTF grade A and B recommendations reviewed with patient; age-appropriate recommendations, preventive care, screening tests, etc discussed and encouraged; healthy living encouraged; see AVS for patient education given to patient -Discussed importance of 150 minutes of physical activity weekly, eat two servings of fish weekly, eat one serving of tree nuts ( cashews, pistachios, pecans, almonds.[email or web address]) every other day, eat 6 servings of fruit/vegetables daily and drink plenty of water and avoid sweet beverages.   -Reviewed Health Maintenance: yes

## 2023-07-04 ENCOUNTER — Encounter: Payer: Self-pay | Admitting: Nurse Practitioner

## 2023-07-04 LAB — LIPID PANEL
Cholesterol: 135 mg/dL (ref <200)
HDL: 62 mg/dL (ref >=50)
LDL Cholesterol (Calc): 56 mg/dL
Non-HDL Cholesterol (Calc): 73 mg/dL (ref <130)
Total CHOL/HDL Ratio: 2.2 (calc) (ref <5.0)
Triglycerides: 88 mg/dL (ref <150)

## 2023-07-04 LAB — COMPLETE METABOLIC PANEL WITH GFR
AG Ratio: 1.8 (calc) (ref 1.0–2.5)
ALT: 36 U/L — ABNORMAL HIGH (ref 6–29)
AST: 35 U/L — ABNORMAL HIGH (ref 10–30)
Albumin: 4.4 g/dL (ref 3.6–5.1)
Alkaline phosphatase (APISO): 51 U/L (ref 31–125)
BUN: 13 mg/dL (ref 7–25)
CO2: 27 mmol/L (ref 20–32)
Calcium: 9.5 mg/dL (ref 8.6–10.2)
Chloride: 104 mmol/L (ref 98–110)
Creat: 0.94 mg/dL (ref 0.50–0.96)
Globulin: 2.5 g/dL (ref 1.9–3.7)
Glucose, Bld: 80 mg/dL (ref 65–99)
Potassium: 4.2 mmol/L (ref 3.5–5.3)
Sodium: 140 mmol/L (ref 135–146)
Total Bilirubin: 0.6 mg/dL (ref 0.2–1.2)
Total Protein: 6.9 g/dL (ref 6.1–8.1)
eGFR: 86 mL/min/{1.73_m2} (ref >=60)

## 2023-07-04 LAB — CBC WITH DIFFERENTIAL/PLATELET
Absolute Lymphocytes: 2152 {cells}/uL (ref 850–3900)
Absolute Monocytes: 466 {cells}/uL (ref 200–950)
Basophils Absolute: 42 {cells}/uL (ref 0–200)
Basophils Relative: 0.8 %
Eosinophils Absolute: 58 {cells}/uL (ref 15–500)
Eosinophils Relative: 1.1 %
HCT: 41.1 % (ref 35.0–45.0)
Hemoglobin: 13.5 g/dL (ref 11.7–15.5)
MCH: 29.6 pg (ref 27.0–33.0)
MCHC: 32.8 g/dL (ref 32.0–36.0)
MCV: 90.1 fL (ref 80.0–100.0)
MPV: 10.8 fL (ref 7.5–12.5)
Monocytes Relative: 8.8 %
Neutro Abs: 2581 {cells}/uL (ref 1500–7800)
Neutrophils Relative %: 48.7 %
Platelets: 283 10*3/uL (ref 140–400)
RBC: 4.56 10*6/uL (ref 3.80–5.10)
RDW: 12.1 % (ref 11.0–15.0)
Total Lymphocyte: 40.6 %
WBC: 5.3 10*3/uL (ref 3.8–10.8)

## 2023-07-04 LAB — HEMOGLOBIN A1C
Hgb A1c MFr Bld: 5.2 %{Hb} (ref <5.7)
Mean Plasma Glucose: 103 mg/dL
eAG (mmol/L): 5.7 mmol/L

## 2023-12-07 ENCOUNTER — Encounter: Payer: Self-pay | Admitting: Nurse Practitioner

## 2023-12-07 ENCOUNTER — Ambulatory Visit: Admitting: Nurse Practitioner

## 2023-12-07 ENCOUNTER — Other Ambulatory Visit: Payer: Self-pay

## 2023-12-07 ENCOUNTER — Ambulatory Visit: Attending: Nurse Practitioner

## 2023-12-07 VITALS — BP 110/62 | HR 51 | Temp 97.6°F | Ht 65.0 in | Wt 134.1 lb

## 2023-12-07 DIAGNOSIS — R001 Bradycardia, unspecified: Secondary | ICD-10-CM | POA: Diagnosis not present

## 2023-12-07 DIAGNOSIS — Z130 Encounter for screening for diseases of the blood and blood-forming organs and certain disorders involving the immune mechanism: Secondary | ICD-10-CM | POA: Diagnosis not present

## 2023-12-07 DIAGNOSIS — R42 Dizziness and giddiness: Secondary | ICD-10-CM | POA: Diagnosis not present

## 2023-12-07 DIAGNOSIS — R32 Unspecified urinary incontinence: Secondary | ICD-10-CM | POA: Diagnosis not present

## 2023-12-07 DIAGNOSIS — R202 Paresthesia of skin: Secondary | ICD-10-CM

## 2023-12-07 DIAGNOSIS — R5383 Other fatigue: Secondary | ICD-10-CM

## 2023-12-07 DIAGNOSIS — N926 Irregular menstruation, unspecified: Secondary | ICD-10-CM

## 2023-12-07 LAB — POCT URINE PREGNANCY: Preg Test, Ur: NEGATIVE

## 2023-12-07 LAB — POCT URINALYSIS DIPSTICK
Bilirubin, UA: NEGATIVE
Blood, UA: NEGATIVE
Glucose, UA: NEGATIVE
Ketones, UA: NEGATIVE
Nitrite, UA: NEGATIVE
Odor: NORMAL
Protein, UA: NEGATIVE
Spec Grav, UA: <=1.005 — AB (ref 1.010–1.025)
Urobilinogen, UA: 0.2 U/dL
pH, UA: 5 (ref 5.0–8.0)

## 2023-12-07 NOTE — Progress Notes (Signed)
 BP 110/62 (BP Location: Left Arm, Patient Position: Sitting, Cuff Size: Normal)   Pulse (!) 51   Temp 97.6 F (36.4 C)   Ht 5' 5 (1.651 m)   Wt 134 lb 1.6 oz (60.8 kg)   LMP 08/17/2023 (Exact Date)   SpO2 99%   BMI 22.32 kg/m    Subjective:    Patient ID: Rebecca Santos, female    DOB: 31-Mar-1996, 28 y.o.   MRN: [id number]  HPI: Rebecca Santos is a 28 y.o. female presenting today with a 1 week history of intermittent nausea, dizziness and tingling in bilateral fingers. She reports she has been prepping for a body-building show since February. She is consuming 1200-1300 calories per day. She reports doing strength training 5 days a week along with fasted cardio in the mornings. Drinks a gallon of water a day along with electrolytes.  Patient reports getting 5-6 hours of sleep at night. She states she is constantly tired and fatigued throughout the day. Her daily supplements include: Biotin,Seamoss, Omega 3, Beef organs, BCAA's, L Glutamine, Creatinine occasionally, magnesium, and Psyllium. Denies chest pain, SOB, or fever.   Dizziness: -Intermittent (3-4 times/week) -No syncope  Fatigue: -Constantly tired and fatigued even with mild walking  -Reports 6 hrs of uninterrupted sleep per night   Nausea: -Intermittent  -Relieved with food and rest   Urinary sx: -Reports some mild urinary incontinence - about 3 times a month  -Obtaining a urine sample today in office   LMP: -3 months ago -Reports not currently sexually active -Denies taking any form of birth control -Will obtain pregnancy test   Bradycardia: -HR obtained twice in office- 51 bpm  -Obtaining EKG, EKG- 47 bpm  -Heart monitor ordered -Referral to cardiology             12/07/2023    8:26 AM 07/03/2023   10:51 AM 02/21/2022    9:50 AM  Depression screen PHQ 2/9  Decreased Interest 0 0 0  Down, Depressed, Hopeless 0 0 0  PHQ - 2 Score 0 0 0  Altered sleeping 0 0   Tired, decreased energy 0 0   Change in  appetite 0 0   Feeling bad or failure about yourself  0 0   Trouble concentrating 0 0   Moving slowly or fidgety/restless 0 0   Suicidal thoughts 0 0   PHQ-9 Score 0 0   Difficult doing work/chores Not difficult at all Not difficult at all     Relevant past medical, surgical, family and social history reviewed and updated as indicated. Interim medical history since our last visit reviewed. Allergies and medications reviewed and updated.  Review of Systems  Ten systems reviewed and is negative except as mentioned in HPI      Objective:     BP 110/62 (BP Location: Left Arm, Patient Position: Sitting, Cuff Size: Normal)   Pulse (!) 51   Temp 97.6 F (36.4 C)   Ht 5' 5 (1.651 m)   Wt 134 lb 1.6 oz (60.8 kg)   LMP 08/17/2023 (Exact Date)   SpO2 99%   BMI 22.32 kg/m    Wt Readings from Last 3 Encounters:  12/07/23 134 lb 1.6 oz (60.8 kg)  07/03/23 152 lb 11.2 oz (69.3 kg)  02/21/22 144 lb 1.6 oz (65.4 kg)    Physical Exam Constitutional:      Appearance: Normal appearance.  HENT:     Head: Normocephalic and atraumatic.   Cardiovascular:     Rate  and Rhythm: Regular rhythm. Bradycardia present.     Heart sounds: Normal heart sounds.  Pulmonary:     Effort: Pulmonary effort is normal.     Breath sounds: Normal breath sounds.  Abdominal:     General: Abdomen is flat.     Palpations: Abdomen is soft.   Musculoskeletal:        General: Normal range of motion.   Skin:    General: Skin is warm and dry.   Neurological:     General: No focal deficit present.     Mental Status: She is alert and oriented to person, place, and time.   Psychiatric:        Mood and Affect: Mood normal.        Behavior: Behavior normal.      Results for orders placed or performed in visit on 07/03/23  CBC with Differential/Platelet   Collection Time: 07/03/23 11:17 AM  Result Value Ref Range   WBC 5.3 3.8 - 10.8 Thousand/uL   RBC 4.56 3.80 - 5.10 Million/uL   Hemoglobin 13.5 11.7  - 15.5 g/dL   HCT 04.5 40.9 - 81.1 %   MCV 90.1 80.0 - 100.0 fL   MCH 29.6 27.0 - 33.0 pg   MCHC 32.8 32.0 - 36.0 g/dL   RDW 91.4 78.2 - 95.6 %   Platelets 283 140 - 400 Thousand/uL   MPV 10.8 7.5 - 12.5 fL   Neutro Abs 2,581 1,500 - 7,800 cells/uL   Absolute Lymphocytes 2,152 850 - 3,900 cells/uL   Absolute Monocytes 466 200 - 950 cells/uL   Eosinophils Absolute 58 15 - 500 cells/uL   Basophils Absolute 42 0 - 200 cells/uL   Neutrophils Relative % 48.7 %   Total Lymphocyte 40.6 %   Monocytes Relative 8.8 %   Eosinophils Relative 1.1 %   Basophils Relative 0.8 %  COMPLETE METABOLIC PANEL WITH GFR   Collection Time: 07/03/23 11:17 AM  Result Value Ref Range   Glucose, Bld 80 65 - 99 mg/dL   BUN 13 7 - 25 mg/dL   Creat 2.13 0.86 - 5.78 mg/dL   eGFR 86 > OR = 60 IO/NGE/9.52W4   BUN/Creatinine Ratio SEE NOTE: 6 - 22 (calc)   Sodium 140 135 - 146 mmol/L   Potassium 4.2 3.5 - 5.3 mmol/L   Chloride 104 98 - 110 mmol/L   CO2 27 20 - 32 mmol/L   Calcium 9.5 8.6 - 10.2 mg/dL   Total Protein 6.9 6.1 - 8.1 g/dL   Albumin 4.4 3.6 - 5.1 g/dL   Globulin 2.5 1.9 - 3.7 g/dL (calc)   AG Ratio 1.8 1.0 - 2.5 (calc)   Total Bilirubin 0.6 0.2 - 1.2 mg/dL   Alkaline phosphatase (APISO) 51 31 - 125 U/L   AST 35 (H) 10 - 30 U/L   ALT 36 (H) 6 - 29 U/L  Lipid panel   Collection Time: 07/03/23 11:17 AM  Result Value Ref Range   Cholesterol 135 <200 mg/dL   HDL 62 > OR = 50 mg/dL   Triglycerides 88 <132 mg/dL   LDL Cholesterol (Calc) 56 mg/dL (calc)   Total CHOL/HDL Ratio 2.2 <5.0 (calc)   Non-HDL Cholesterol (Calc) 73 <440 mg/dL (calc)  Hemoglobin N0U   Collection Time: 07/03/23 11:17 AM  Result Value Ref Range   Hgb A1c MFr Bld 5.2 <5.7 % of total Hgb   Mean Plasma Glucose 103 mg/dL   eAG (mmol/L) 5.7 mmol/L  EKG: sinus bradycardia.     Assessment & Plan:   Problem List Items Addressed This Visit   None Visit Diagnoses       Urinary incontinence, unspecified type    -   Primary   Obtaining urine dip in office positive for leuks,  culture sent in     Bradycardia       HR 51- checked twice. Obtaining EKG and checking blood work. Orderd heart monitor and sent referral to cardiology.   Relevant Orders   LONG TERM MONITOR (3-14 DAYS)   Ambulatory referral to Cardiology     Screening for deficiency anemia       Obtaining labs   Relevant Orders   CBC with Differential/Platelet     Dizziness       Obtaining labs and EKG. Ordered heart monitor and sent referral to cardiology   Relevant Orders   Comprehensive metabolic panel with GFR   LONG TERM MONITOR (3-14 DAYS)   Ambulatory referral to Cardiology     Fatigue, unspecified type       Obtaining labs   Relevant Orders   Thyroid Panel With TSH   CBC with Differential/Platelet   Vitamin D  (25 hydroxy)   LONG TERM MONITOR (3-14 DAYS)   Ambulatory referral to Cardiology     Tingling       Intermittent tingling in bilateral fingers. Obtaining labs and EKG. Ordered heart monitor and sent a referral to Cardiology   Relevant Orders   Vitamin B12   Comprehensive metabolic panel with GFR   LONG TERM MONITOR (3-14 DAYS)   Ambulatory referral to Cardiology        Zio heart monitor ordered (14 days). Referral to Cardiology sent. Educated patient that we would contact her with lab results.       Follow up plan: Return if symptoms worsen or fail to improve.  I have reviewed this encounter including the documentation in this note and/or discussed this patient with the provider, Aislinn Womack, SNP, I am certifying that I agree with the content of this note as supervising/preceptor nurse practitioner.  Donny Gall, FNP-C [facility] 12/07/2023, 10:00 AM

## 2023-12-08 ENCOUNTER — Encounter: Payer: Self-pay | Admitting: Nurse Practitioner

## 2023-12-08 LAB — COMPREHENSIVE METABOLIC PANEL WITH GFR
AG Ratio: 1.9 (calc) (ref 1.0–2.5)
ALT: 62 U/L — ABNORMAL HIGH (ref 6–29)
AST: 47 U/L — ABNORMAL HIGH (ref 10–30)
Albumin: 4.7 g/dL (ref 3.6–5.1)
Alkaline phosphatase (APISO): 47 U/L (ref 31–125)
BUN/Creatinine Ratio: 37 (calc) — ABNORMAL HIGH (ref 6–22)
BUN: 34 mg/dL — ABNORMAL HIGH (ref 7–25)
CO2: 29 mmol/L (ref 20–32)
Calcium: 9.8 mg/dL (ref 8.6–10.2)
Chloride: 102 mmol/L (ref 98–110)
Creat: 0.91 mg/dL (ref 0.50–0.96)
Globulin: 2.5 g/dL (ref 1.9–3.7)
Glucose, Bld: 87 mg/dL (ref 65–99)
Potassium: 4.7 mmol/L (ref 3.5–5.3)
Sodium: 139 mmol/L (ref 135–146)
Total Bilirubin: 0.4 mg/dL (ref 0.2–1.2)
Total Protein: 7.2 g/dL (ref 6.1–8.1)
eGFR: 90 mL/min/{1.73_m2} (ref >=60)

## 2023-12-08 LAB — CBC WITH DIFFERENTIAL/PLATELET
Absolute Lymphocytes: 1857 {cells}/uL (ref 850–3900)
Absolute Monocytes: 299 {cells}/uL (ref 200–950)
Basophils Absolute: 22 {cells}/uL (ref 0–200)
Basophils Relative: 0.5 %
Eosinophils Absolute: 62 {cells}/uL (ref 15–500)
Eosinophils Relative: 1.4 %
HCT: 41.2 % (ref 35.0–45.0)
Hemoglobin: 13.5 g/dL (ref 11.7–15.5)
MCH: 31.3 pg (ref 27.0–33.0)
MCHC: 32.8 g/dL (ref 32.0–36.0)
MCV: 95.6 fL (ref 80.0–100.0)
MPV: 11.7 fL (ref 7.5–12.5)
Monocytes Relative: 6.8 %
Neutro Abs: 2160 {cells}/uL (ref 1500–7800)
Neutrophils Relative %: 49.1 %
Platelets: 209 10*3/uL (ref 140–400)
RBC: 4.31 10*6/uL (ref 3.80–5.10)
RDW: 13.2 % (ref 11.0–15.0)
Total Lymphocyte: 42.2 %
WBC: 4.4 10*3/uL (ref 3.8–10.8)

## 2023-12-08 LAB — THYROID PANEL WITH TSH
Free Thyroxine Index: 1.4 (ref 1.4–3.8)
T3 Uptake: 29 % (ref 22–35)
T4, Total: 4.8 ug/dL — ABNORMAL LOW (ref 5.1–11.9)
TSH: 1.16 m[IU]/L

## 2023-12-08 LAB — VITAMIN B12: Vitamin B-12: 1340 pg/mL — ABNORMAL HIGH (ref 200–1100)

## 2023-12-08 LAB — VITAMIN D 25 HYDROXY (VIT D DEFICIENCY, FRACTURES): Vit D, 25-Hydroxy: 93 ng/mL (ref 30–100)

## 2023-12-10 ENCOUNTER — Ambulatory Visit: Payer: Self-pay | Admitting: Nurse Practitioner

## 2023-12-19 ENCOUNTER — Other Ambulatory Visit: Payer: Self-pay | Admitting: Nurse Practitioner

## 2023-12-19 DIAGNOSIS — R7989 Other specified abnormal findings of blood chemistry: Secondary | ICD-10-CM

## 2023-12-19 DIAGNOSIS — R748 Abnormal levels of other serum enzymes: Secondary | ICD-10-CM

## 2023-12-27 ENCOUNTER — Encounter: Payer: Self-pay | Admitting: Nurse Practitioner

## 2023-12-27 ENCOUNTER — Other Ambulatory Visit: Payer: Self-pay

## 2023-12-27 ENCOUNTER — Other Ambulatory Visit: Payer: Self-pay | Admitting: Nurse Practitioner

## 2023-12-27 ENCOUNTER — Ambulatory Visit: Payer: Self-pay | Admitting: Nurse Practitioner

## 2023-12-27 DIAGNOSIS — R748 Abnormal levels of other serum enzymes: Secondary | ICD-10-CM

## 2023-12-27 DIAGNOSIS — R42 Dizziness and giddiness: Secondary | ICD-10-CM

## 2023-12-28 LAB — COMPREHENSIVE METABOLIC PANEL WITH GFR
AG Ratio: 2 (calc) (ref 1.0–2.5)
ALT: 76 U/L — ABNORMAL HIGH (ref 6–29)
AST: 57 U/L — ABNORMAL HIGH (ref 10–30)
Albumin: 4.8 g/dL (ref 3.6–5.1)
Alkaline phosphatase (APISO): 50 U/L (ref 31–125)
BUN/Creatinine Ratio: 32 (calc) — ABNORMAL HIGH (ref 6–22)
BUN: 29 mg/dL — ABNORMAL HIGH (ref 7–25)
CO2: 22 mmol/L (ref 20–32)
Calcium: 9.9 mg/dL (ref 8.6–10.2)
Chloride: 107 mmol/L (ref 98–110)
Creat: 0.9 mg/dL (ref 0.50–0.96)
Globulin: 2.4 g/dL (ref 1.9–3.7)
Glucose, Bld: 82 mg/dL (ref 65–99)
Potassium: 4.1 mmol/L (ref 3.5–5.3)
Sodium: 140 mmol/L (ref 135–146)
Total Bilirubin: 0.4 mg/dL (ref 0.2–1.2)
Total Protein: 7.2 g/dL (ref 6.1–8.1)
eGFR: 91 mL/min/{1.73_m2} (ref >=60)

## 2023-12-28 LAB — TEST AUTHORIZATION

## 2023-12-28 LAB — HEPATIC FUNCTION PANEL
AG Ratio: 2 (calc) (ref 1.0–2.5)
ALT: 76 U/L — ABNORMAL HIGH (ref 6–29)
AST: 57 U/L — ABNORMAL HIGH (ref 10–30)
Albumin: 4.8 g/dL (ref 3.6–5.1)
Alkaline phosphatase (APISO): 50 U/L (ref 31–125)
Bilirubin, Direct: 0.1 mg/dL (ref 0.0–0.2)
Globulin: 2.4 g/dL (ref 1.9–3.7)
Indirect Bilirubin: 0.3 mg/dL (ref 0.2–1.2)
Total Bilirubin: 0.4 mg/dL (ref 0.2–1.2)
Total Protein: 7.2 g/dL (ref 6.1–8.1)

## 2023-12-28 LAB — HEP PANEL, GENERAL
Hep B Core Total Ab: NONREACTIVE
Hep B S Ab: REACTIVE — AB
Hepatitis A AB,Total: REACTIVE — AB
Hepatitis B Surface Ag: NONREACTIVE
Hepatitis C Ab: NONREACTIVE

## 2023-12-28 LAB — TEST AUTHORIZATION 2

## 2023-12-28 LAB — T4, FREE: Free T4: 0.9 ng/dL (ref 0.8–1.8)

## 2024-01-02 ENCOUNTER — Ambulatory Visit

## 2024-01-11 DIAGNOSIS — R42 Dizziness and giddiness: Secondary | ICD-10-CM | POA: Diagnosis not present

## 2024-01-11 DIAGNOSIS — R202 Paresthesia of skin: Secondary | ICD-10-CM | POA: Diagnosis not present

## 2024-01-11 DIAGNOSIS — R001 Bradycardia, unspecified: Secondary | ICD-10-CM

## 2024-01-11 DIAGNOSIS — R5383 Other fatigue: Secondary | ICD-10-CM

## 2024-02-05 ENCOUNTER — Telehealth: Payer: Self-pay | Admitting: *Deleted

## 2024-02-05 NOTE — Telephone Encounter (Signed)
 Unable to LVM to verify card hx.

## 2024-02-10 NOTE — Progress Notes (Deleted)
 NO SHOW

## 2024-02-12 ENCOUNTER — Ambulatory Visit: Admitting: Cardiovascular Disease

## 2024-02-14 ENCOUNTER — Encounter: Payer: Self-pay | Admitting: Nurse Practitioner

## 2024-02-14 DIAGNOSIS — R748 Abnormal levels of other serum enzymes: Secondary | ICD-10-CM

## 2024-02-16 ENCOUNTER — Encounter: Payer: Self-pay | Admitting: Nurse Practitioner

## 2024-02-16 ENCOUNTER — Ambulatory Visit: Admitting: Nurse Practitioner

## 2024-02-16 VITALS — BP 118/64 | HR 79 | Resp 18 | Ht 65.0 in | Wt 154.0 lb

## 2024-02-16 DIAGNOSIS — R748 Abnormal levels of other serum enzymes: Secondary | ICD-10-CM | POA: Diagnosis not present

## 2024-02-16 DIAGNOSIS — Z131 Encounter for screening for diabetes mellitus: Secondary | ICD-10-CM

## 2024-02-16 DIAGNOSIS — N912 Amenorrhea, unspecified: Secondary | ICD-10-CM | POA: Diagnosis not present

## 2024-02-16 NOTE — Progress Notes (Signed)
 BP 118/64   Pulse 79   Resp 18   Ht 5' 5 (1.651 m)   Wt 154 lb (69.9 kg)   LMP 08/03/2023 (Approximate)   SpO2 99%   BMI 25.63 kg/m    Subjective:    Patient ID: Rebecca Santos, female    DOB: July 14, 1995, 28 y.o.   MRN: [id number]  HPI: Rebecca Santos is a 28 y.o. female  Chief Complaint  Patient presents with   Labs Only    Last period in feb. Was in intense work out/diet mod has since stopped and still no period    Discussed the use of AI scribe software for clinical note transcription with the patient, who gave verbal consent to proceed.  History of Present Illness A 28 year old female presents for a repeat liver enzyme evaluation and amenorrhea.  Elevated hepatic transaminases - Repeat evaluation for elevated liver enzymes after previous comprehensive metabolic panel showed AST 57 and ALT 76, increased from prior values of AST 47 and ALT 62 - No history of alcohol consumption - No history of hepatitis; hepatitis panel negative for acute infection - Liver enzyme elevation present since January, prior to initiation of intense bodybuilding workout and diet routine - Liver ultrasound not completed due to financial constraints - Concern regarding persistently elevated liver enzymes  Amenorrhea and menstrual irregularity - Amenorrhea since February; last menstrual period in February - History of irregular menses during high school when highly active in sports, but no prior complete cessation of menses - Not currently sexually active - Since discontinuing intense workout routine and reintroducing gluten, dairy, and sugar, has experienced rapid weight gain and general malaise  Vasomotor symptoms - Hot flashes and night sweats, particularly prominent last week - Attributes symptoms to possible hormonal changes         02/16/2024    9:14 AM 12/07/2023    8:26 AM 07/03/2023   10:51 AM  Depression screen PHQ 2/9  Decreased Interest 0 0 0  Down, Depressed, Hopeless 0 0 0  PHQ -  2 Score 0 0 0  Altered sleeping 0 0 0  Tired, decreased energy 0 0 0  Change in appetite 0 0 0  Feeling bad or failure about yourself  0 0 0  Trouble concentrating 0 0 0  Moving slowly or fidgety/restless 0 0 0  Suicidal thoughts 0 0 0  PHQ-9 Score 0 0 0  Difficult doing work/chores Not difficult at all Not difficult at all Not difficult at all    Relevant past medical, surgical, family and social history reviewed and updated as indicated. Interim medical history since our last visit reviewed. Allergies and medications reviewed and updated.  Review of Systems  Ten systems reviewed and is negative except as mentioned in HPI      Objective:     BP 118/64   Pulse 79   Resp 18   Ht 5' 5 (1.651 m)   Wt 154 lb (69.9 kg)   LMP 08/03/2023 (Approximate)   SpO2 99%   BMI 25.63 kg/m    Wt Readings from Last 3 Encounters:  02/16/24 154 lb (69.9 kg)  12/07/23 134 lb 1.6 oz (60.8 kg)  07/03/23 152 lb 11.2 oz (69.3 kg)    Physical Exam Physical Exam GENERAL: Alert, cooperative, well developed, no acute distress HEENT: Normocephalic, normal oropharynx, moist mucous membranes CHEST: Clear to auscultation bilaterally, No wheezes, rhonchi, or crackles CARDIOVASCULAR: Normal heart rate and rhythm, S1 and S2 normal without murmurs ABDOMEN:  Soft, non-tender, non-distended, without organomegaly, Normal bowel sounds EXTREMITIES: No cyanosis or edema NEUROLOGICAL: Cranial nerves grossly intact, Moves all extremities without gross motor or sensory deficit   Results for orders placed or performed in visit on 12/19/23  T4, free   Collection Time: 12/26/23  9:33 AM  Result Value Ref Range   Free T4 0.9 0.8 - 1.8 ng/dL  Hepatic function panel   Collection Time: 12/26/23  9:33 AM  Result Value Ref Range   Total Protein 7.2 6.1 - 8.1 g/dL   Albumin 4.8 3.6 - 5.1 g/dL   Globulin 2.4 1.9 - 3.7 g/dL (calc)   AG Ratio 2.0 1.0 - 2.5 (calc)   Total Bilirubin 0.4 0.2 - 1.2 mg/dL   Bilirubin,  Direct 0.1 0.0 - 0.2 mg/dL   Indirect Bilirubin 0.3 0.2 - 1.2 mg/dL (calc)   Alkaline phosphatase (APISO) 50 31 - 125 U/L   AST 57 (H) 10 - 30 U/L   ALT 76 (H) 6 - 29 U/L  HEP PANEL, GENERAL   Collection Time: 12/26/23  9:33 AM  Result Value Ref Range   Hepatitis A AB,Total REACTIVE (A) NON-REACTIVE   Hep B S Ab REACTIVE (A) NON-REACTIVE   Hepatitis B Surface Ag NON-REACTIVE NON-REACTIVE   Hep B Core Total Ab NON-REACTIVE NON-REACTIVE   Hepatitis C Ab NON-REACTIVE NON-REACTIVE  Comprehensive metabolic panel with GFR   Collection Time: 12/26/23  9:33 AM  Result Value Ref Range   Glucose, Bld 82 65 - 99 mg/dL   BUN 29 (H) 7 - 25 mg/dL   Creat 9.09 9.49 - 9.03 mg/dL   eGFR 91 > OR = 60 fO/fpw/8.26f7   BUN/Creatinine Ratio 32 (H) 6 - 22 (calc)   Sodium 140 135 - 146 mmol/L   Potassium 4.1 3.5 - 5.3 mmol/L   Chloride 107 98 - 110 mmol/L   CO2 22 20 - 32 mmol/L   Calcium 9.9 8.6 - 10.2 mg/dL   Total Protein 7.2 6.1 - 8.1 g/dL   Albumin 4.8 3.6 - 5.1 g/dL   Globulin 2.4 1.9 - 3.7 g/dL (calc)   AG Ratio 2.0 1.0 - 2.5 (calc)   Total Bilirubin 0.4 0.2 - 1.2 mg/dL   Alkaline phosphatase (APISO) 50 31 - 125 U/L   AST 57 (H) 10 - 30 U/L   ALT 76 (H) 6 - 29 U/L  TEST AUTHORIZATION   Collection Time: 12/26/23  9:33 AM  Result Value Ref Range   TEST NAME: COMPREHENSIVE METABOLIC    TEST CODE: [id number]    CLIENT CONTACT: Shanna Un/SMITH    REPORT ALWAYS MESSAGE SIGNATURE    TEST AUTHORIZATION 2   Collection Time: 12/26/23  9:33 AM  Result Value Ref Range   TEST NAME: HEPATITIS PANEL, GENERAL    TEST CODE: [id number]    CLIENT CONTACT: LESLIE SMITH QUEST    REPORT ALWAYS MESSAGE SIGNATURE            Assessment & Plan:   Problem List Items Addressed This Visit   None Visit Diagnoses       Amenorrhea    -  Primary   Relevant Orders   CBC with Differential/Platelet   Comprehensive metabolic panel with GFR   FSH/LH   Prolactin   Estradiol   Testosterone , Free and Total    hCG, serum, qualitative   Thyroid  Panel With TSH     Elevated liver enzymes       Relevant Orders   Comprehensive metabolic panel with  GFR   Gamma GT     Screening for diabetes mellitus       Relevant Orders   Comprehensive metabolic panel with GFR   Hemoglobin A1c        Assessment and Plan Assessment & Plan Elevated liver enzymes Elevated AST at 57 and ALT at 76, increased from previous values of 47 and 62. Hepatitis panel negative for acute infection. No alcohol use. Possible influence from previous intense workout and diet routine for bodybuilding. Financial constraints have delayed liver ultrasound. No symptoms of severe liver dysfunction. - Order gamma GT and CMP to recheck liver enzymes - Consider liver ultrasound if liver enzyme levels remain elevated  Secondary amenorrhea Amenorrhea since February. Irregular periods during high physical activity in high school. No sexual activity. Possible hormonal imbalance due to previous intense workout and dietary changes. Reports hot flashes and night sweats, suggesting hormonal fluctuations. - Order CBC, TSH, FSH, LH, prolactin, estradiol, testosterone, and beta-HCG to evaluate hormonal status  Abnormal weight gain Rapid weight gain following cessation of extreme workout routine and reintroduction of gluten, dairy, and sugar into diet. Feels unwell, attributing symptoms to dietary changes. - Monitor weight and dietary intake - Advise on dietary modifications to manage weight gain  Hot flashes Reports of hot flashes and night sweats, occurring intermittently, with increased frequency last week. Possible hormonal imbalance considered. - Evaluate hormonal status with ordered labs (TSH, FSH, LH, prolactin, estradiol, testosterone)  Fatigue and malaise Reports feeling unwell and sick frequently, possibly related to dietary changes and rapid weight gain. No specific cause identified at this time. - Monitor symptoms and reassess after lab  results        Follow up plan: Return if symptoms worsen or fail to improve.

## 2024-02-19 ENCOUNTER — Ambulatory Visit: Payer: Self-pay | Admitting: Nurse Practitioner

## 2024-02-19 NOTE — Addendum Note (Signed)
 Addended by: YVONE WARREN BROCKS on: 02/19/2024 11:39 AM   Modules accepted: Orders

## 2024-02-21 LAB — LAB REPORT - SCANNED

## 2024-02-22 ENCOUNTER — Ambulatory Visit
Admission: RE | Admit: 2024-02-22 | Discharge: 2024-02-22 | Disposition: A | Discharge status: Home / Self Care | Source: Ambulatory Visit | Attending: Nurse Practitioner | Admitting: Nurse Practitioner

## 2024-02-22 ENCOUNTER — Inpatient Hospital Stay: Admitting: Nurse Practitioner

## 2024-02-22 DIAGNOSIS — R748 Abnormal levels of other serum enzymes: Secondary | ICD-10-CM | POA: Diagnosis present

## 2024-02-27 ENCOUNTER — Ambulatory Visit: Payer: Self-pay | Admitting: Nurse Practitioner

## 2024-02-27 ENCOUNTER — Inpatient Hospital Stay: Admitting: Nurse Practitioner

## 2024-02-27 DIAGNOSIS — R748 Abnormal levels of other serum enzymes: Secondary | ICD-10-CM

## 2024-03-01 LAB — CBC WITH DIFFERENTIAL/PLATELET
Absolute Lymphocytes: 1896 {cells}/uL (ref 850–3900)
Absolute Monocytes: 629 {cells}/uL (ref 200–950)
Basophils Absolute: 43 {cells}/uL (ref 0–200)
Basophils Relative: 0.5 %
Eosinophils Absolute: 119 {cells}/uL (ref 15–500)
Eosinophils Relative: 1.4 %
HCT: 42.3 % (ref 35.0–45.0)
Hemoglobin: 13.5 g/dL (ref 11.7–15.5)
MCH: 31.2 pg (ref 27.0–33.0)
MCHC: 31.9 g/dL — ABNORMAL LOW (ref 32.0–36.0)
MCV: 97.7 fL (ref 80.0–100.0)
MPV: 10.3 fL (ref 7.5–12.5)
Monocytes Relative: 7.4 %
Neutro Abs: 5814 {cells}/uL (ref 1500–7800)
Neutrophils Relative %: 68.4 %
Platelets: 272 Thousand/uL (ref 140–400)
RBC: 4.33 Million/uL (ref 3.80–5.10)
RDW: 11.3 % (ref 11.0–15.0)
Total Lymphocyte: 22.3 %
WBC: 8.5 Thousand/uL (ref 3.8–10.8)

## 2024-03-01 LAB — COMPREHENSIVE METABOLIC PANEL WITH GFR
AG Ratio: 1.7 (calc) (ref 1.0–2.5)
ALT: 85 U/L — ABNORMAL HIGH (ref 6–29)
AST: 38 U/L — ABNORMAL HIGH (ref 10–30)
Albumin: 4.7 g/dL (ref 3.6–5.1)
Alkaline phosphatase (APISO): 64 U/L (ref 31–125)
BUN/Creatinine Ratio: 31 (calc) — ABNORMAL HIGH (ref 6–22)
BUN: 26 mg/dL — ABNORMAL HIGH (ref 7–25)
CO2: 31 mmol/L (ref 20–32)
Calcium: 9.8 mg/dL (ref 8.6–10.2)
Chloride: 99 mmol/L (ref 98–110)
Creat: 0.84 mg/dL (ref 0.50–0.96)
Globulin: 2.7 g/dL (ref 1.9–3.7)
Glucose, Bld: 85 mg/dL (ref 65–99)
Potassium: 4.1 mmol/L (ref 3.5–5.3)
Sodium: 140 mmol/L (ref 135–146)
Total Bilirubin: 0.4 mg/dL (ref 0.2–1.2)
Total Protein: 7.4 g/dL (ref 6.1–8.1)
eGFR: 98 mL/min/1.73m2 (ref >=60)

## 2024-03-01 LAB — HCG, SERUM, QUALITATIVE: Preg, Serum: NEGATIVE

## 2024-03-01 LAB — GAMMA GT: GGT: 42 U/L — ABNORMAL HIGH (ref 3–40)

## 2024-03-01 LAB — THYROID PANEL WITH TSH
Free Thyroxine Index: 1.8 (ref 1.4–3.8)
T3 Uptake: 28 % (ref 22–35)
T4, Total: 6.4 ug/dL (ref 5.1–11.9)
TSH: 1.39 m[IU]/L

## 2024-03-01 LAB — ESTRADIOL, FREE
Estradiol, Free: 1.67 pg/mL
Estradiol: 77 pg/mL

## 2024-03-01 LAB — HEMOGLOBIN A1C
Hgb A1c MFr Bld: 5.3 % (ref <5.7)
Mean Plasma Glucose: 105 mg/dL
eAG (mmol/L): 5.8 mmol/L

## 2024-03-01 LAB — PROLACTIN: Prolactin: 10 ng/mL

## 2024-03-01 LAB — FSH/LH
FSH: 3.1 m[IU]/mL
LH: 9.3 m[IU]/mL

## 2024-03-01 LAB — TESTOSTERONE, FREE & TOTAL
Free Testosterone: 6.5 pg/mL — ABNORMAL HIGH (ref 0.1–6.4)
Testosterone, Total, LC-MS-MS: 36 ng/dL (ref 2–45)

## 2024-03-13 ENCOUNTER — Ambulatory Visit: Admitting: Internal Medicine

## 2024-04-10 ENCOUNTER — Encounter: Payer: Self-pay | Admitting: Nurse Practitioner

## 2024-04-10 ENCOUNTER — Ambulatory Visit

## 2024-04-22 ENCOUNTER — Ambulatory Visit: Admitting: Nurse Practitioner

## 2024-06-24 ENCOUNTER — Ambulatory Visit: Admitting: Family Medicine
